# Patient Record
Sex: Female | Born: 2000
Health system: Southern US, Community
[De-identification: ages and names within clinical notes are randomized; demographics above are authoritative.]

---
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
## 2018-02-28 DIAGNOSIS — R000 Tachycardia, unspecified: Secondary | ICD-10-CM | POA: Diagnosis not present

## 2018-02-28 DIAGNOSIS — R112 Nausea with vomiting, unspecified: Secondary | ICD-10-CM | POA: Diagnosis not present

## 2018-06-25 DIAGNOSIS — R05 Cough: Secondary | ICD-10-CM | POA: Diagnosis not present

## 2018-06-25 DIAGNOSIS — B349 Viral infection, unspecified: Secondary | ICD-10-CM | POA: Diagnosis not present

## 2018-06-25 DIAGNOSIS — R509 Fever, unspecified: Secondary | ICD-10-CM | POA: Diagnosis not present

## 2018-06-25 DIAGNOSIS — R0602 Shortness of breath: Secondary | ICD-10-CM | POA: Diagnosis not present

## 2019-02-26 ENCOUNTER — Emergency Department (HOSPITAL_COMMUNITY): Payer: BC Managed Care – PPO

## 2019-02-26 ENCOUNTER — Emergency Department (HOSPITAL_COMMUNITY)
Admission: EM | Admit: 2019-02-26 | Discharge: 2019-02-26 | Disposition: A | Discharge status: Home / Self Care | Payer: BC Managed Care – PPO | Attending: Emergency Medicine | Admitting: Emergency Medicine

## 2019-02-26 ENCOUNTER — Other Ambulatory Visit: Payer: Self-pay

## 2019-02-26 ENCOUNTER — Encounter (HOSPITAL_COMMUNITY): Payer: Self-pay | Admitting: Emergency Medicine

## 2019-02-26 DIAGNOSIS — N1330 Unspecified hydronephrosis: Secondary | ICD-10-CM | POA: Insufficient documentation

## 2019-02-26 DIAGNOSIS — I959 Hypotension, unspecified: Secondary | ICD-10-CM | POA: Diagnosis not present

## 2019-02-26 DIAGNOSIS — R1032 Left lower quadrant pain: Secondary | ICD-10-CM

## 2019-02-26 DIAGNOSIS — N132 Hydronephrosis with renal and ureteral calculous obstruction: Secondary | ICD-10-CM | POA: Diagnosis not present

## 2019-02-26 DIAGNOSIS — N201 Calculus of ureter: Secondary | ICD-10-CM | POA: Diagnosis not present

## 2019-02-26 DIAGNOSIS — R52 Pain, unspecified: Secondary | ICD-10-CM | POA: Diagnosis not present

## 2019-02-26 DIAGNOSIS — R1084 Generalized abdominal pain: Secondary | ICD-10-CM | POA: Diagnosis not present

## 2019-02-26 DIAGNOSIS — R0689 Other abnormalities of breathing: Secondary | ICD-10-CM | POA: Diagnosis not present

## 2019-02-26 LAB — COMPREHENSIVE METABOLIC PANEL WITH GFR
ALT: 17 U/L (ref 0–44)
AST: 18 U/L (ref 15–41)
Albumin: 4.1 g/dL (ref 3.5–5.0)
Alkaline Phosphatase: 60 U/L (ref 38–126)
Anion gap: 12 (ref 5–15)
BUN: 11 mg/dL (ref 6–20)
CO2: 21 mmol/L — ABNORMAL LOW (ref 22–32)
Calcium: 9.5 mg/dL (ref 8.9–10.3)
Chloride: 107 mmol/L (ref 98–111)
Creatinine, Ser: 0.84 mg/dL (ref 0.44–1.00)
GFR calc Af Amer: >60 mL/min
GFR calc non Af Amer: >60 mL/min
Glucose, Bld: 124 mg/dL — ABNORMAL HIGH (ref 70–99)
Potassium: 3.6 mmol/L (ref 3.5–5.1)
Sodium: 140 mmol/L (ref 135–145)
Total Bilirubin: 1.8 mg/dL — ABNORMAL HIGH (ref 0.3–1.2)
Total Protein: 7.2 g/dL (ref 6.5–8.1)

## 2019-02-26 LAB — CBC
HCT: 41.8 % (ref 36.0–46.0)
Hemoglobin: 14.1 g/dL (ref 12.0–15.0)
MCH: 29.3 pg (ref 26.0–34.0)
MCHC: 33.7 g/dL (ref 30.0–36.0)
MCV: 86.7 fL (ref 80.0–100.0)
Platelets: 291 10*3/uL (ref 150–400)
RBC: 4.82 MIL/uL (ref 3.87–5.11)
RDW: 11.7 % (ref 11.5–15.5)
WBC: 7.9 10*3/uL (ref 4.0–10.5)
nRBC: 0 % (ref 0.0–0.2)

## 2019-02-26 LAB — LIPASE, BLOOD: Lipase: 25 U/L (ref 11–51)

## 2019-02-26 LAB — I-STAT BETA HCG BLOOD, ED (MC, WL, AP ONLY): I-stat hCG, quantitative: <5 m[IU]/mL

## 2019-02-26 MED ORDER — SODIUM CHLORIDE 0.9% FLUSH: 3.0000 mL | Freq: Once | INTRAVENOUS | Status: DC

## 2019-02-26 MED ORDER — ONDANSETRON 4 MG PO TBDP: 4.0000 mg | ORAL_TABLET | Freq: Three times a day (TID) | ORAL | 0 refills | Status: AC | PRN

## 2019-02-26 MED ORDER — OXYCODONE-ACETAMINOPHEN 5-325 MG PO TABS: 1.0000 | ORAL_TABLET | Freq: Four times a day (QID) | ORAL | 0 refills | Status: AC | PRN

## 2019-02-26 MED ORDER — PROMETHAZINE HCL 25 MG/ML IJ SOLN
12.5000 mg | Freq: Once | INTRAMUSCULAR | Status: AC
  Administered 2019-02-26: 12.5 mg via INTRAVENOUS
  Filled 2019-02-26: qty 1

## 2019-02-26 MED ORDER — HYDROMORPHONE HCL 1 MG/ML IJ SOLN
1.0000 mg | Freq: Once | INTRAMUSCULAR | Status: AC
  Administered 2019-02-26: 1 mg via INTRAVENOUS
  Filled 2019-02-26: qty 1

## 2019-02-26 MED ORDER — KETOROLAC TROMETHAMINE 30 MG/ML IJ SOLN
30.0000 mg | Freq: Once | INTRAMUSCULAR | Status: AC
  Administered 2019-02-26: 30 mg via INTRAVENOUS
  Filled 2019-02-26: qty 1

## 2019-02-26 MED ORDER — IBUPROFEN 800 MG PO TABS: 800.0000 mg | ORAL_TABLET | Freq: Three times a day (TID) | ORAL | 0 refills | Status: AC | PRN

## 2019-02-26 MED ORDER — ONDANSETRON HCL 4 MG/2ML IJ SOLN
4.0000 mg | Freq: Once | INTRAMUSCULAR | Status: AC
  Administered 2019-02-26: 4 mg via INTRAVENOUS
  Filled 2019-02-26: qty 2

## 2019-02-26 NOTE — ED Notes (Signed)
Dr. Jacqulyn Bath made aware that patient is requesting more pain and nausea medicine

## 2019-02-26 NOTE — ED Notes (Signed)
ED provider at bedside.

## 2019-02-26 NOTE — ED Notes (Signed)
Patient transported to [facility] 

## 2019-02-26 NOTE — Discharge Instructions (Signed)
You have been seen in the Emergency Department (ED) today for pain that we believe based on your workup, is caused by kidney stones.  As we have discussed, please drink plenty of fluids.  Please make a follow up appointment with the physician(s) listed elsewhere in this documentation. ° °You may take pain medication as needed but ONLY as prescribed.  Please also take your prescribed Flomax daily.  We also recommend that you take over-the-counter ibuprofen regularly according to label instructions over the next 5 days.  Take it with meals to minimize stomach discomfort. ° °Please see your doctor as soon as possible as stones may take 1-3 weeks to pass and you may require additional care or medications. ° °Do not drink alcohol, drive or participate in any other potentially dangerous activities while taking opiate pain medication as it may make you sleepy. Do not take this medication with any other sedating medications, either prescription or over-the-counter. If you were prescribed Percocet or Vicodin, do not take these with acetaminophen (Tylenol) as it is already contained within these medications. °  °Take Percocet as needed for severe pain.  This medication is an opiate (or narcotic) pain medication and can be habit forming.  Use it as little as possible to achieve adequate pain control.  Do not use or use it with extreme caution if you have a history of opiate abuse or dependence.  If you are on a pain contract with your primary care doctor or a pain specialist, be sure to let them know you were prescribed this medication today from the Emergency Department.  This medication is intended for your use only - do not give any to anyone else and keep it in a secure place where nobody else, especially children, have access to it.  It will also cause or worsen constipation, so you may want to consider taking an over-the-counter stool softener while you are taking this medication. ° °Return to the Emergency Department  (ED) or call your doctor if you have any worsening pain, fever, painful urination, are unable to urinate, or develop other symptoms that concern you. ° ° ° °Kidney Stones °Kidney stones (urolithiasis) are deposits that form inside your kidneys. The intense pain is caused by the stone moving through the urinary tract. When the stone moves, the ureter goes into spasm around the stone. The stone is usually passed in the urine.  °CAUSES  °A disorder that makes certain neck glands produce too much parathyroid hormone (primary hyperparathyroidism). °A buildup of uric acid crystals, similar to gout in your joints. °Narrowing (stricture) of the ureter. °A kidney obstruction present at birth (congenital obstruction). °Previous surgery on the kidney or ureters. °Numerous kidney infections. °SYMPTOMS  °Feeling sick to your stomach (nauseous). °Throwing up (vomiting). °Blood in the urine (hematuria). °Pain that usually spreads (radiates) to the groin. °Frequency or urgency of urination. °DIAGNOSIS  °Taking a history and physical exam. °Blood or urine tests. °CT scan. °Occasionally, an examination of the inside of the urinary bladder (cystoscopy) is performed. °TREATMENT  °Observation. °Increasing your fluid intake. °Extracorporeal shock wave lithotripsy--This is a noninvasive procedure that uses shock waves to break up kidney stones. °Surgery may be needed if you have severe pain or persistent obstruction. There are various surgical procedures. Most of the procedures are performed with the use of small instruments. Only small incisions are needed to accommodate these instruments, so recovery time is minimized. °The size, location, and chemical composition are all important variables that will determine the proper   choice of action for you. Talk to your health care provider to better understand your situation so that you will minimize the risk of injury to yourself and your kidney.  °HOME CARE INSTRUCTIONS  °Drink enough water  and fluids to keep your urine clear or pale yellow. This will help you to pass the stone or stone fragments. °Strain all urine through the provided strainer. Keep all particulate matter and stones for your health care provider to see. The stone causing the pain may be as small as a grain of salt. It is very important to use the strainer each and every time you pass your urine. The collection of your stone will allow your health care provider to analyze it and verify that a stone has actually passed. The stone analysis will often identify what you can do to reduce the incidence of recurrences. °Only take over-the-counter or prescription medicines for pain, discomfort, or fever as directed by your health care provider. °Keep all follow-up visits as told by your health care provider. This is important. °Get follow-up X-rays if required. The absence of pain does not always mean that the stone has passed. It may have only stopped moving. If the urine remains completely obstructed, it can cause loss of kidney function or even complete destruction of the kidney. It is your responsibility to make sure X-rays and follow-ups are completed. Ultrasounds of the kidney can show blockages and the status of the kidney. Ultrasounds are not associated with any radiation and can be performed easily in a matter of minutes. °Make changes to your daily diet as told by your health care provider. You may be told to: °Limit the amount of salt that you eat. °Eat 5 or more servings of fruits and vegetables each day. °Limit the amount of meat, poultry, fish, and eggs that you eat. °Collect a 24-hour urine sample as told by your health care provider. You may need to collect another urine sample every 6-12 months. °SEEK MEDICAL CARE IF: °You experience pain that is progressive and unresponsive to any pain medicine you have been prescribed. °SEEK IMMEDIATE MEDICAL CARE IF:  °Pain cannot be controlled with the prescribed medicine. °You have a  fever or shaking chills. °The severity or intensity of pain increases over 18 hours and is not relieved by pain medicine. °You develop a new onset of abdominal pain. °You feel faint or pass out. °You are unable to urinate. °  °This information is not intended to replace advice given to you by your health care provider. Make sure you discuss any questions you have with your health care provider. °  °Document Released: 01/20/2005 Document Revised: 10/11/2014 Document Reviewed: 06/23/2012 °Elsevier Interactive Patient Education ©2016 [employer] ° ° °

## 2019-02-26 NOTE — ED Provider Notes (Signed)
Emergency Department Provider Note   I have reviewed the triage vital signs and the nursing notes.   HISTORY  Chief Complaint Abdominal Pain and Emesis   HPI Renee Mueller is a 19 y.o. female presents to the emergency department by EMS with acute onset left lower quadrant abdominal pain.  Patient states that she was driving down the road when she suddenly developed severe, debilitating pain in the left lower quadrant with nausea and vomiting.  She pulled over at a gas station and called 911.  She is not experiencing chest pain or shortness of breath.  No similar pain in the past.  Denies any diarrhea, vaginal discharge, dysuria, hesitancy, urgency.  No concern for pregnancy. Patient given Fentanyl and Zofran en route with mild reduction in symptoms.   History reviewed. No pertinent past medical history.  There are no problems to display for this patient.   History reviewed. No pertinent surgical history.  Allergies Patient has no known allergies.  No family history on file.  Social History Social History   Tobacco Use  . Smoking status: Never Smoker  . Smokeless tobacco: Never Used  Substance Use Topics  . Alcohol use: Never  . Drug use: Never    Review of Systems  Constitutional: No fever/chills Eyes: No visual changes. ENT: No sore throat. Cardiovascular: Denies chest pain. Respiratory: Denies shortness of breath. Gastrointestinal: LLQ abdominal pain. Positive nausea and vomiting.  No diarrhea.  No constipation. Genitourinary: Negative for dysuria. Musculoskeletal: Negative for back pain. Skin: Negative for rash. Neurological: Negative for headaches, focal weakness or numbness.  10-point ROS otherwise negative.  ____________________________________________   PHYSICAL EXAM:  VITAL SIGNS: ED Triage Vitals [02/26/19 0947]  Enc Vitals Group     BP 118/88     Pulse Rate 99     Resp 20     Temp 98.1 F (36.7 C)     Temp Source Oral     SpO2 100 %    Constitutional: Alert and oriented but appears very uncomfortable with frequent shifting in bed.  Eyes: Conjunctivae are normal. Head: Atraumatic. Nose: No congestion/rhinnorhea. Mouth/Throat: Mucous membranes are moist.   Neck: No stridor.  Cardiovascular: Normal rate, regular rhythm. Good peripheral circulation. Grossly normal heart sounds.   Respiratory: Normal respiratory effort.  No retractions. Lungs CTAB. Gastrointestinal: Soft with mild diffuse tenderness without rebound or guarding. No distention.  Musculoskeletal:  No gross deformities of extremities. Neurologic:  Normal speech and language.  Skin:  Skin is warm, dry and intact. No rash noted.  ____________________________________________   LABS (all labs ordered are listed, but only abnormal results are displayed)  Labs Reviewed  COMPREHENSIVE METABOLIC PANEL - Abnormal; Notable for the following components:      Result Value   CO2 21 (*)    Glucose, Bld 124 (*)    Total Bilirubin 1.8 (*)    All other components within normal limits  LIPASE, BLOOD  CBC  I-STAT BETA HCG BLOOD, ED ([facility], [facility], [facility] ONLY)   ____________________________________________  RADIOLOGY  CT Renal Stone Study  Result Date: 02/26/2019 CLINICAL DATA:  Flank pain along the left side. Radiates to the back. EXAM: CT ABDOMEN AND PELVIS WITHOUT CONTRAST TECHNIQUE: Multidetector CT imaging of the abdomen and pelvis was performed following the standard protocol without IV contrast. COMPARISON:  None. FINDINGS: Lower chest: No acute abnormality. Hepatobiliary: No focal liver abnormality is seen. No gallstones, gallbladder wall thickening, or biliary dilatation. Pancreas: Unremarkable. No pancreatic ductal dilatation or surrounding inflammatory changes.  Spleen: Normal in size without focal abnormality. Adrenals/Urinary Tract: Adrenal glands are unremarkable. Punctate nonobstructing right renal calculus. Mild left hydroureteronephrosis with mild left periureteral  hazy inflammatory changes. 3 mm calcification in the left lower pelvis which may reflect a phlebolith versus a distal left ureteral calculus (image 75/series 3). Bladder is unremarkable. Stomach/Bowel: Stomach is within normal limits. Appendix appears normal. No evidence of bowel wall thickening, distention, or inflammatory changes. Vascular/Lymphatic: No significant vascular findings are present. No enlarged abdominal or pelvic lymph nodes. Reproductive: Uterus and bilateral adnexa are unremarkable. Other: No abdominal wall hernia or abnormality. No abdominopelvic ascites. Musculoskeletal: No acute or significant osseous findings. IMPRESSION: 1. Mild left hydroureteronephrosis with mild left periureteral hazy inflammatory changes. 3 mm calcification in the left lower pelvis which may reflect a phlebolith versus a distal left ureteral calculus (image 75/series 3). Electronically Signed   By: Kathreen Devoid   On: 02/26/2019 12:09   US PELVIC COMPLETE W TRANSVAGINAL AND TORSION R/O  Result Date: 02/26/2019 CLINICAL DATA:  Left lower quadrant pain EXAM: TRANSABDOMINAL AND TRANSVAGINAL ULTRASOUND OF PELVIS DOPPLER ULTRASOUND OF OVARIES TECHNIQUE: Both transabdominal and transvaginal ultrasound examinations of the pelvis were performed. Transabdominal technique was performed for global imaging of the pelvis including uterus, ovaries, adnexal regions, and pelvic cul-de-sac. It was necessary to proceed with endovaginal exam following the transabdominal exam to visualize the endometrium and bilateral ovaries. Color and duplex Doppler ultrasound was utilized to evaluate blood flow to the ovaries. COMPARISON:  None. FINDINGS: Uterus Measurements: 6.3 x 2.8 x 3.5 cm = volume: 32.8 mL. No fibroids or other mass visualized. Endometrium Thickness: 3 mm.  No focal abnormality visualized. Right ovary Measurements: 3.2 x 2.3 x 1.8 cm = volume: 7.0 mL. Normal appearance/no adnexal mass. Left ovary Measurements: 3.2 x 1.8 x 2.1 cm =  volume: 6.0 mL. Normal appearance/no adnexal mass. Pulsed Doppler evaluation of both ovaries demonstrates normal low-resistance arterial and venous waveforms. Other findings No abnormal free fluid. IMPRESSION: Negative pelvic ultrasound. No evidence of ovarian torsion. Electronically Signed   By: Julian Hy M.D.   On: 02/26/2019 11:10    ____________________________________________   PROCEDURES  Procedure(s) performed:   Procedures  None ____________________________________________   INITIAL IMPRESSION / ASSESSMENT AND PLAN / ED COURSE  Pertinent labs & imaging results that were available during my care of the patient were reviewed by me and considered in my medical decision making (see chart for details).   Patient presents to the emergency department with acute onset left lower quadrant abdominal pain.  She appears very uncomfortable upon arrival.  Her pregnancy test is negative.  Vital signs are largely unremarkable.  Initial concern is to rule out acute ovarian torsion and patient was given pain medication and taken immediately for pelvic ultrasound.  This result was reviewed which shows normal ultrasound with no evidence of torsion.  Other considerations include kidney stone.  Patient was sent for CT renal and results are pending.  Labs reviewed.  Continue pain management and reassess.  TVUS negative for torsion. CT renal sent with left kidney stone which correlates with patients presentation. Plan for discharge home with pain meds and Urology follow up. [place] drug database reviewed prior to Rx for Percocet. Discussed ED return precautions.  ____________________________________________  FINAL CLINICAL IMPRESSION(S) / ED DIAGNOSES  Final diagnoses:  Ureterolithiasis     MEDICATIONS GIVEN DURING THIS VISIT:  Medications  ondansetron (ZOFRAN) injection 4 mg (4 mg Intravenous Given 02/26/19 1017)  HYDROmorphone (DILAUDID) injection 1 mg (1  mg Intravenous Given 02/26/19 1017)    ketorolac (TORADOL) 30 MG/ML injection 30 mg (30 mg Intravenous Given 02/26/19 1137)  promethazine (PHENERGAN) injection 12.5 mg (12.5 mg Intravenous Given 02/26/19 1137)     NEW OUTPATIENT MEDICATIONS STARTED DURING THIS VISIT:  Discharge Medication List as of 02/26/2019  1:25 PM    START taking these medications   Details  ibuprofen (ADVIL) 800 MG tablet Take 1 tablet (800 mg total) by mouth every 8 (eight) hours as needed., Starting Sat 02/26/2019, Normal    ondansetron (ZOFRAN ODT) 4 MG disintegrating tablet Take 1 tablet (4 mg total) by mouth every 8 (eight) hours as needed for nausea or vomiting., Starting Sat 02/26/2019, Normal    oxyCODONE-acetaminophen (PERCOCET/ROXICET) 5-325 MG tablet Take 1 tablet by mouth every 6 (six) hours as needed for severe pain., Starting Sat 02/26/2019, Normal        Note:  This document was prepared using Dragon voice recognition software and may include unintentional dictation errors.  Alona Bene, MD, [facility]    Avis Tirone, Arlyss Repress, MD 02/26/19 1910

## 2019-02-26 NOTE — ED Triage Notes (Signed)
Pt to triage via [facility].  C/o sudden onset of LLQ pain, nausea, and vomiting that started 15 min before EMS arrival.  Pt was driving and pulled over at gas station to call EMS.  Denies diarrhea.  Pt writhing in pain.  EMS administered Fentanyl 50 mcg and Zofran 4mg  PTA.

## 2019-02-26 NOTE — ED Notes (Signed)
Patient verbalized understanding of dc instructions, vss, ambulatory with nad.   

## 2019-02-26 NOTE — ED Notes (Signed)
Patient transported to CT 

## 2019-03-02 ENCOUNTER — Ambulatory Visit: Payer: Self-pay

## 2019-03-02 ENCOUNTER — Ambulatory Visit: Payer: Self-pay | Admitting: *Deleted

## 2019-03-02 DIAGNOSIS — M79601 Pain in right arm: Secondary | ICD-10-CM | POA: Diagnosis not present

## 2019-03-02 DIAGNOSIS — I808 Phlebitis and thrombophlebitis of other sites: Secondary | ICD-10-CM | POA: Diagnosis not present

## 2019-03-02 DIAGNOSIS — I809 Phlebitis and thrombophlebitis of unspecified site: Secondary | ICD-10-CM | POA: Diagnosis not present

## 2019-03-02 NOTE — Telephone Encounter (Signed)
Pt called back received call and provied recommendations.  On another call voiced understanding.

## 2019-03-02 NOTE — Telephone Encounter (Signed)
Incoming call from Patient  With complaint that she was in the ED being treated for Kidney Stones. Received  Iv medication for Pain.   Patient states that her elbow is sore notices her elbow is sore and slightly swollen Recommend Patient go to Urgent care to have it evaluated.  Patient voiced understanding.      Reason for Disposition . Entire arm is swollen  Answer Assessment - Initial Assessment Questions 1. ONSET: "When did the pain start?"    Past 2 days  2. LOCATION: "Where is the pain located?"     Right elbow 3. PAIN: "How bad is the pain?" (Scale 1-10; or mild, moderate, severe) - MILD - doesn't interfere with normal activities - MODERATE - interferes with normal activities (e.g., work or school) or awakens from sleep - SEVERE - excruciating pain, unable to do use arm at all     mild 4. WORK OR EXERCISE: "Has there been any recent work or exercise that involved this part of the body?"     denies 5. CAUSE: "What do you think is causing the elbow pain?"     *No Answer* 6. OTHER SYMPTOMS: "Do you have any other symptoms?" (e.g., neck pain, elbow swelling, rash, fever)     Pink, swollen  7. PREGNANCY: "Is there any chance you are pregnant?" "When was your last menstrual period?"     Not pregnant.  Protocols used: ELBOW PAIN-A-AH

## 2019-03-02 NOTE — Telephone Encounter (Signed)
Summary: IV pain   Pt stated she went to the ED on Saturday and had an IV put in near wrist on her right hand and now the bend of her elbow is somewhat red and painful. She would like to speak with a nurse about whether this is normal.      Attempted to call home number- message states disconnected. Left message on cell number to call back regarding symptoms.

## 2019-04-07 DIAGNOSIS — Z79899 Other long term (current) drug therapy: Secondary | ICD-10-CM | POA: Diagnosis not present

## 2019-04-07 DIAGNOSIS — Z131 Encounter for screening for diabetes mellitus: Secondary | ICD-10-CM | POA: Diagnosis not present

## 2019-04-07 DIAGNOSIS — F418 Other specified anxiety disorders: Secondary | ICD-10-CM | POA: Diagnosis not present

## 2019-04-07 DIAGNOSIS — R6889 Other general symptoms and signs: Secondary | ICD-10-CM | POA: Diagnosis not present

## 2019-04-07 DIAGNOSIS — Z6823 Body mass index (BMI) 23.0-23.9, adult: Secondary | ICD-10-CM | POA: Diagnosis not present

## 2019-04-21 DIAGNOSIS — F418 Other specified anxiety disorders: Secondary | ICD-10-CM | POA: Diagnosis not present

## 2019-04-21 DIAGNOSIS — Z6823 Body mass index (BMI) 23.0-23.9, adult: Secondary | ICD-10-CM | POA: Diagnosis not present

## 2019-12-23 DIAGNOSIS — M67431 Ganglion, right wrist: Secondary | ICD-10-CM | POA: Diagnosis not present

## 2020-09-30 IMAGING — US US PELVIS COMPLETE TRANSABD/TRANSVAG W DUPLEX
1 series · 13 of 25 positions shown · non-contrast
Comparison: None.

CLINICAL DATA: Left lower quadrant pain

EXAM:
TRANSABDOMINAL AND TRANSVAGINAL ULTRASOUND OF PELVIS
DOPPLER ULTRASOUND OF OVARIES
TECHNIQUE: Both transabdominal and transvaginal ultrasound examinations of the
pelvis were performed. Transabdominal technique was performed for
global imaging of the pelvis including uterus, ovaries, adnexal
regions, and pelvic cul-de-sac.
It was necessary to proceed with endovaginal exam following the
transabdominal exam to visualize the endometrium and bilateral
ovaries. Color and duplex Doppler ultrasound was utilized to
evaluate blood flow to the ovaries.

[Series 1: us pelvis complete transabd/transvag w duplex · 13 of 85 slices shown]
[im 1/85]
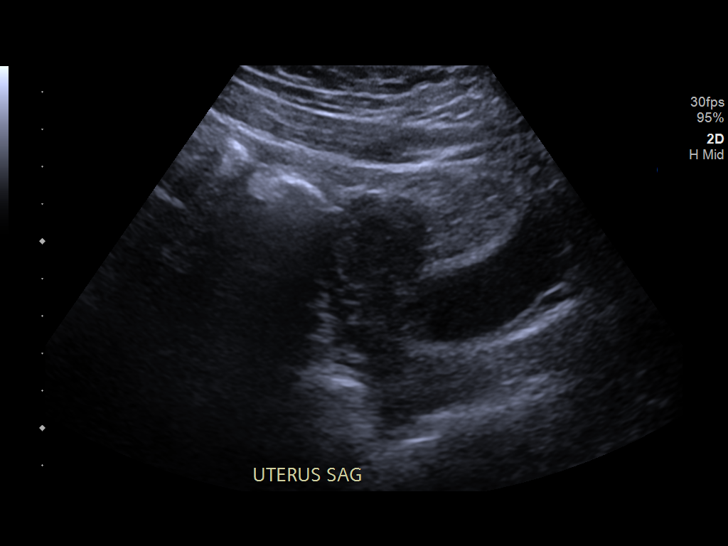
[im 8/85]
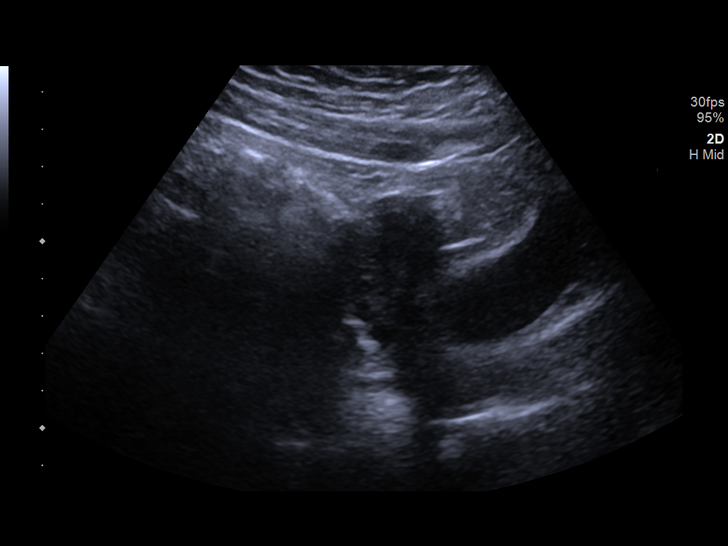
[im 15/85]
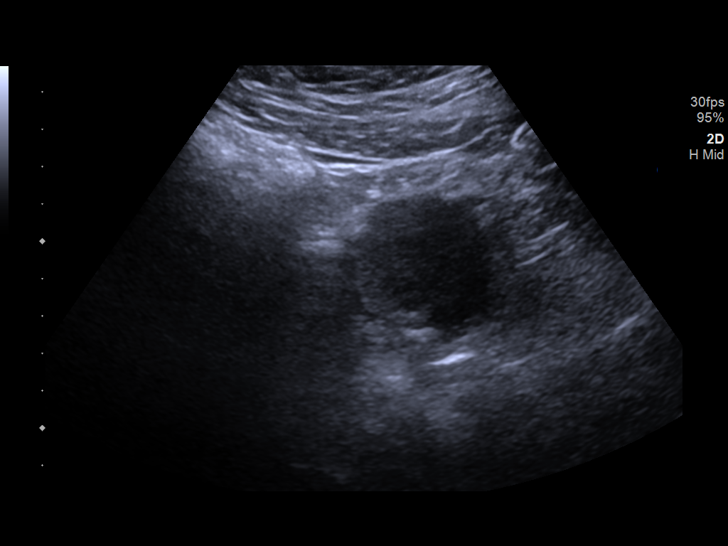
[im 22/85]
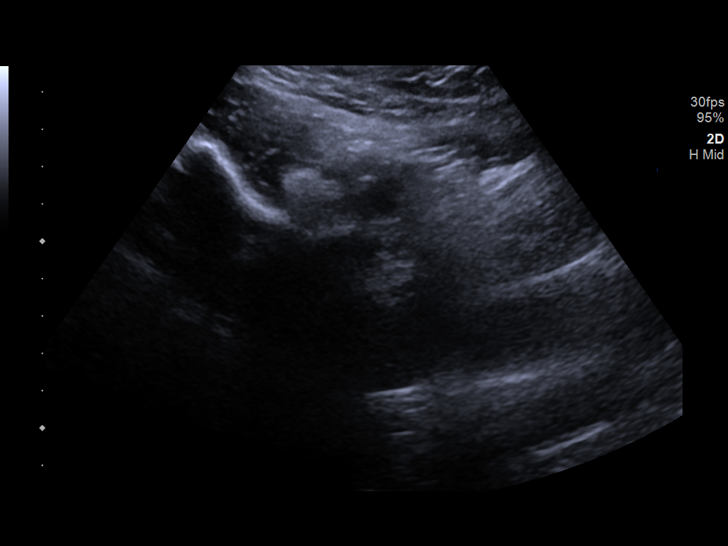
[im 29/85]
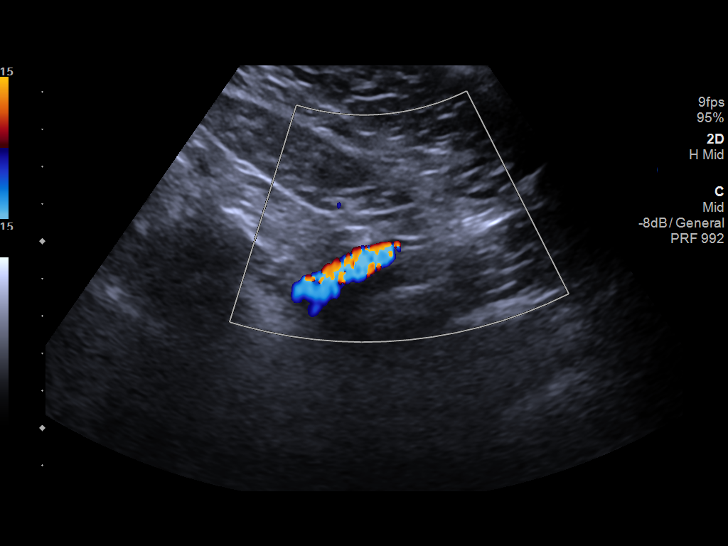
[im 36/85]
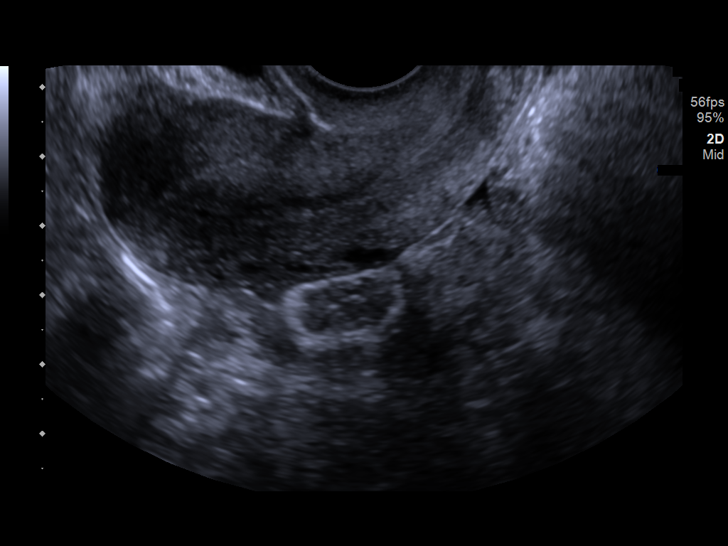
[im 43/85]
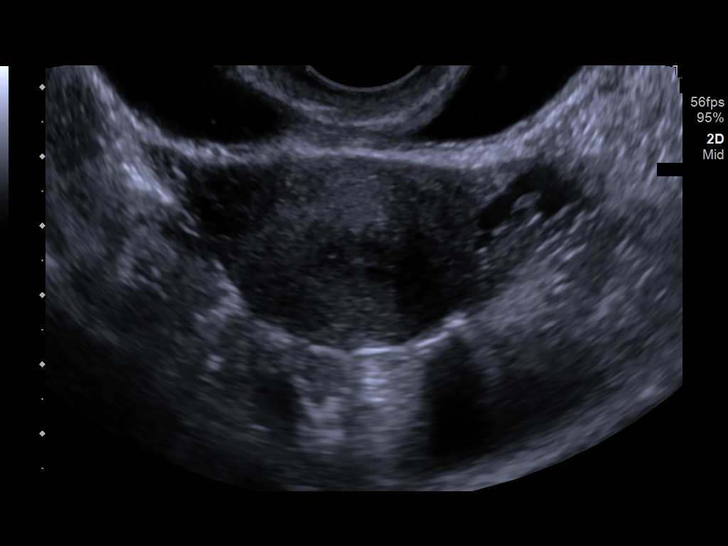
[im 50/85]
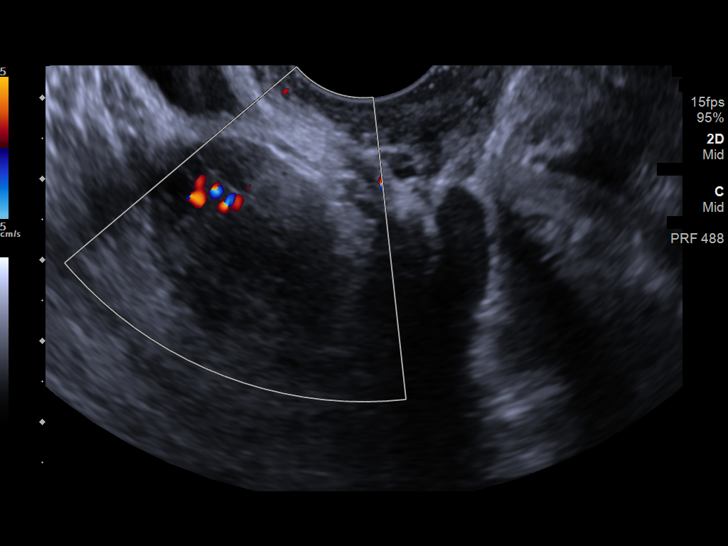
[im 57/85]
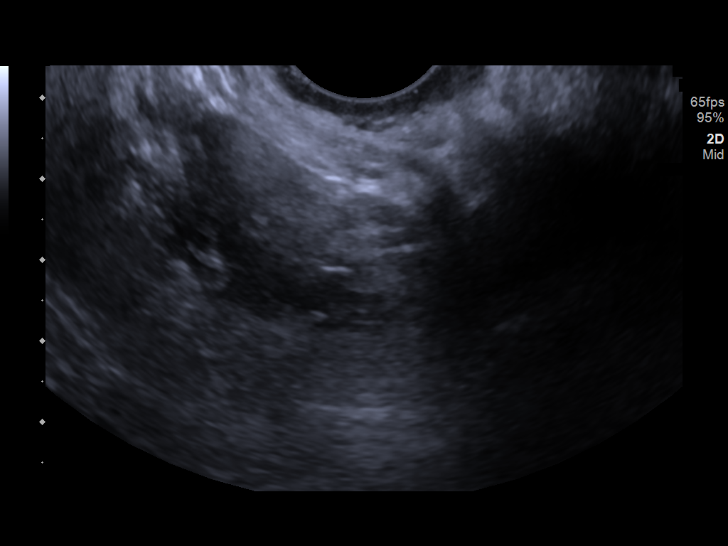
[im 64/85]
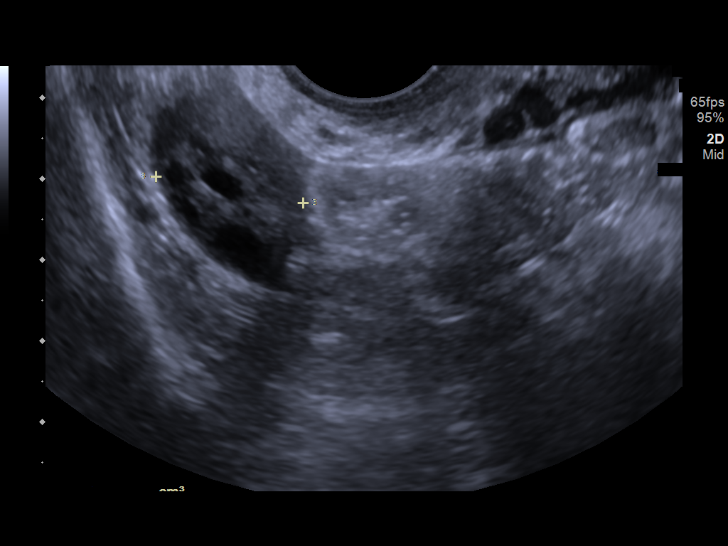
[im 71/85]
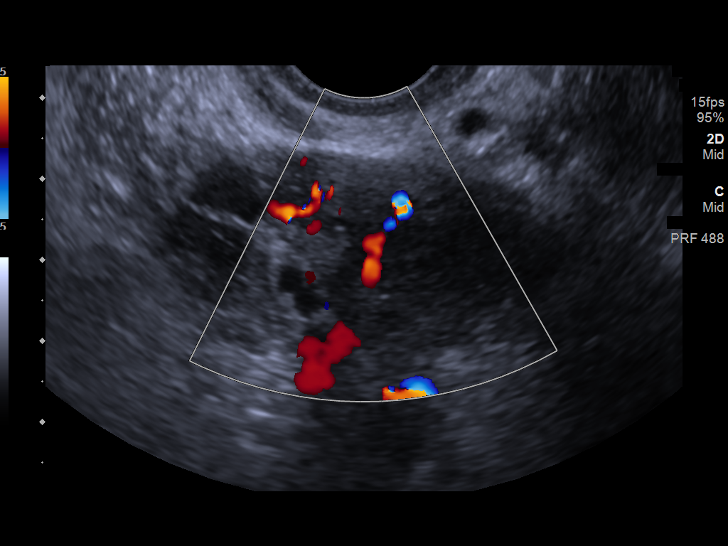
[im 78/85]
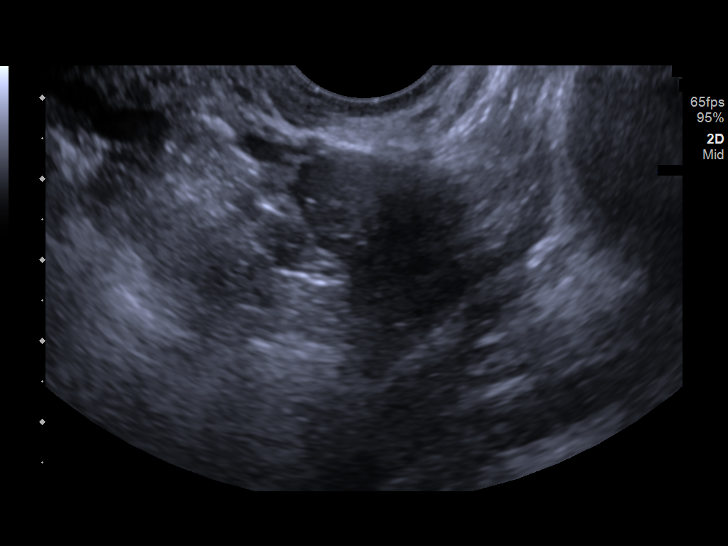
[im 85/85]
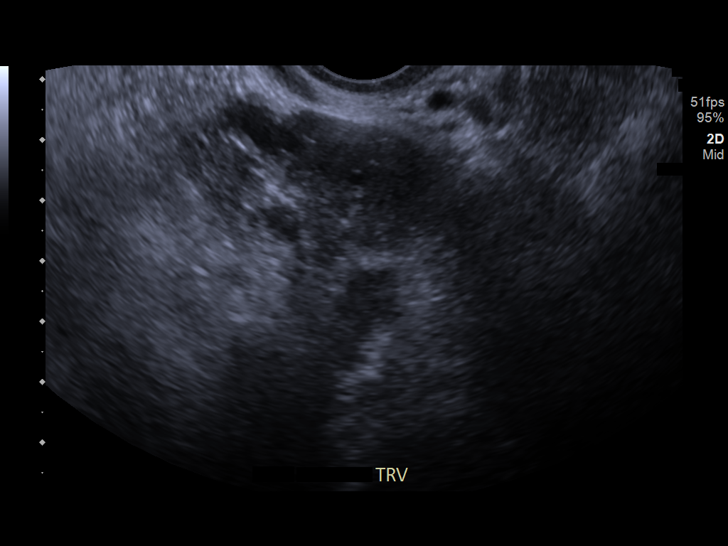

[13 of 25 positions shown; findings below may reference images not displayed]

FINDINGS: Uterus

Measurements: 6.3 x 2.8 x 3.5 cm = volume: 32.8 mL. No fibroids or
other mass visualized.

Endometrium

Thickness: 3 mm.  No focal abnormality visualized.

Right ovary

Measurements: 3.2 x 2.3 x 1.8 cm = volume: 7.0 mL. Normal
appearance/no adnexal mass.

Left ovary

Measurements: 3.2 x 1.8 x 2.1 cm = volume: 6.0 mL. Normal
appearance/no adnexal mass.

Pulsed Doppler evaluation of both ovaries demonstrates normal
low-resistance arterial and venous waveforms.

Other findings

No abnormal free fluid.
IMPRESSION: Negative pelvic ultrasound.

No evidence of ovarian torsion.

## 2020-09-30 IMAGING — CT CT RENAL STONE PROTOCOL
2 of 4 series · 16 of 46 positions shown, 18 images · non-contrast
Comparison: None.

CLINICAL DATA: Flank pain along the left side. Radiates to the
back.

EXAM:
CT ABDOMEN AND PELVIS WITHOUT CONTRAST
TECHNIQUE: Multidetector CT imaging of the abdomen and pelvis was performed
following the standard protocol without IV contrast.

[Series 3: ap without · axial · non-contrast · 0.76mm/px · z∈[+712,+1117]mm · 13 of 93 slices shown, 15 images]
[im 6/93  soft-tissue]
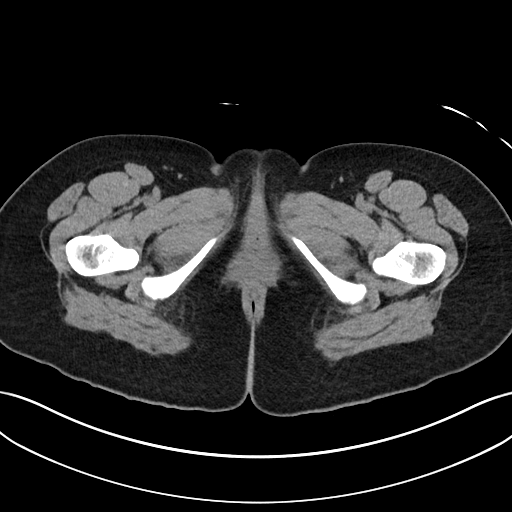
[im 6/93  bone]
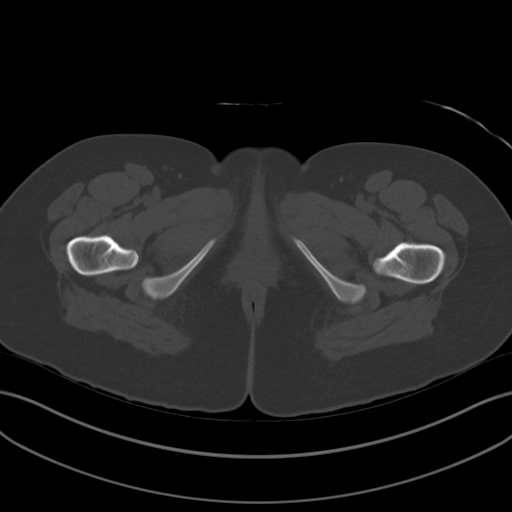
[im 11/93  soft-tissue]
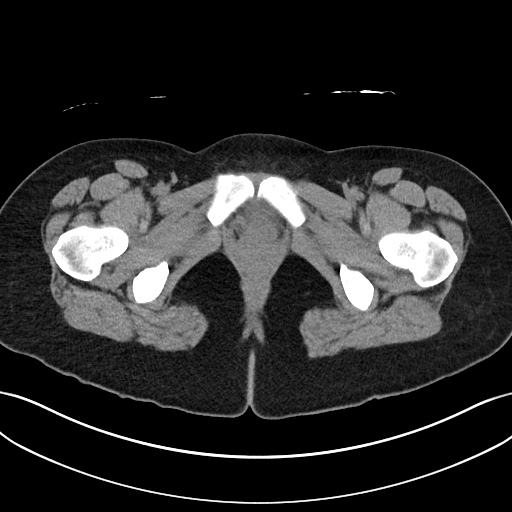
[im 21/93  soft-tissue]
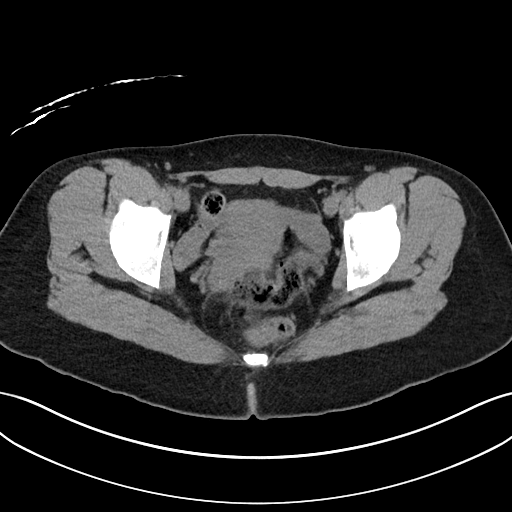
[im 26/93  soft-tissue]
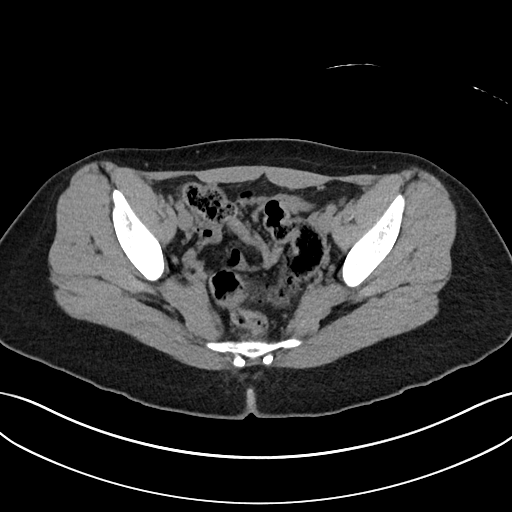
[im 31/93  soft-tissue]
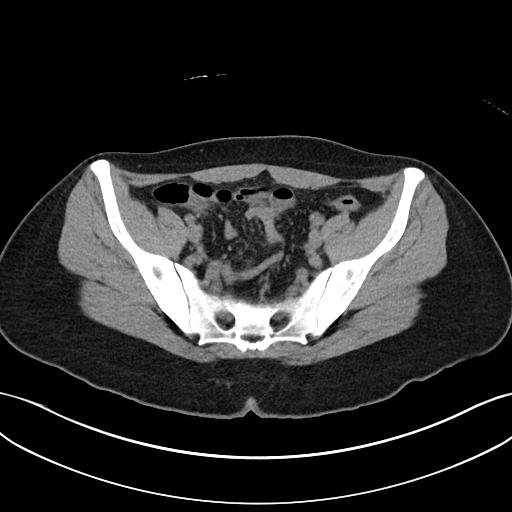
[im 41/93  soft-tissue]
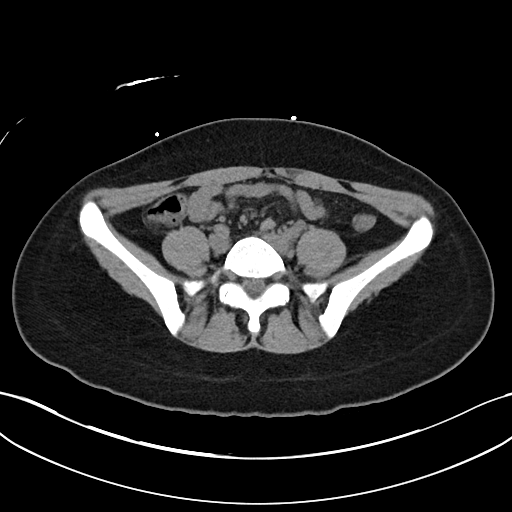
[im 47/93  soft-tissue]
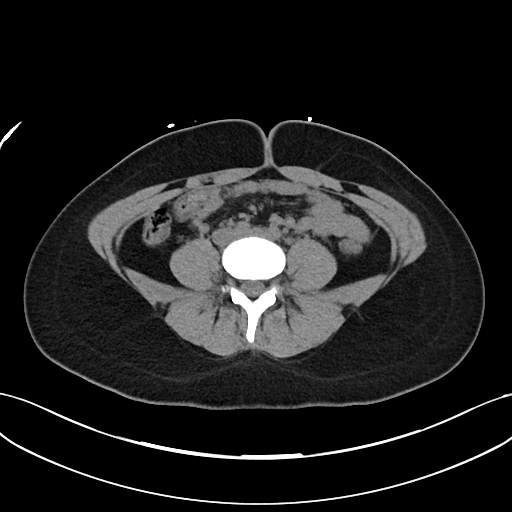
[im 52/93  soft-tissue]
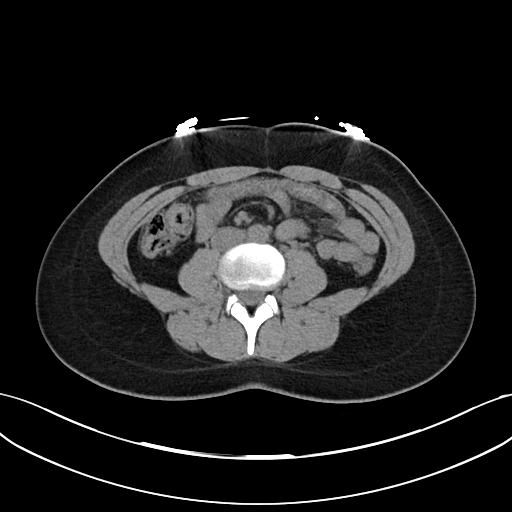
[im 62/93  soft-tissue]
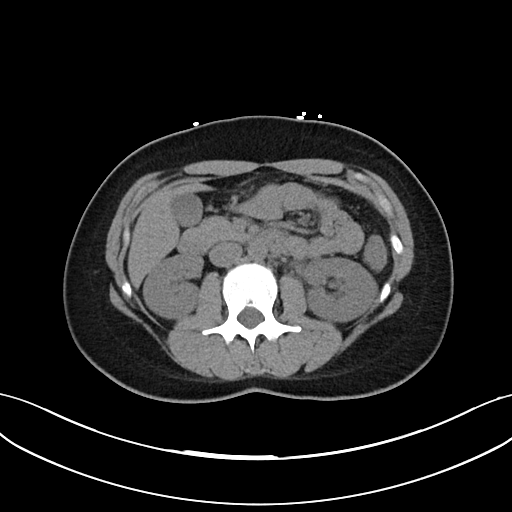
[im 62/93  bone]
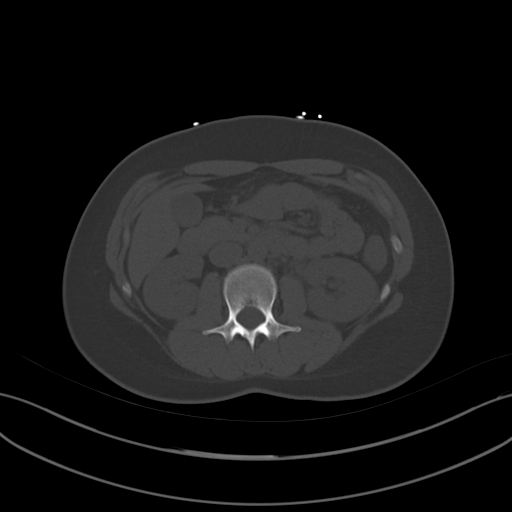
[im 67/93  soft-tissue]
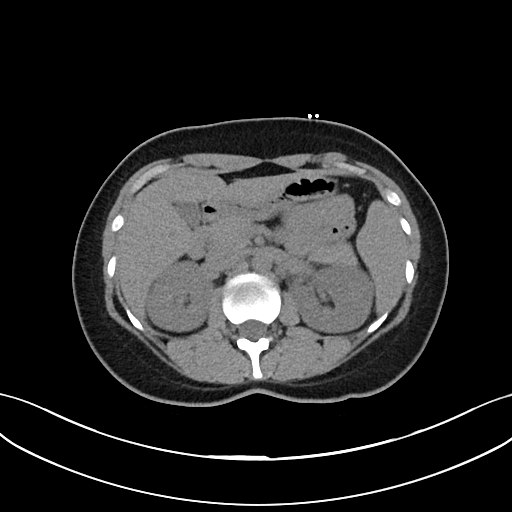
[im 72/93  soft-tissue]
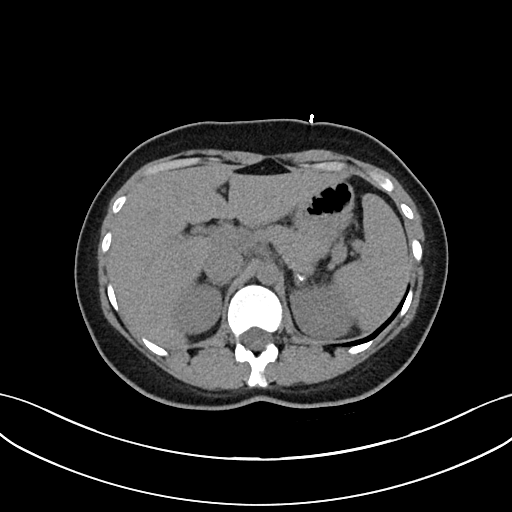
[im 82/93  soft-tissue]
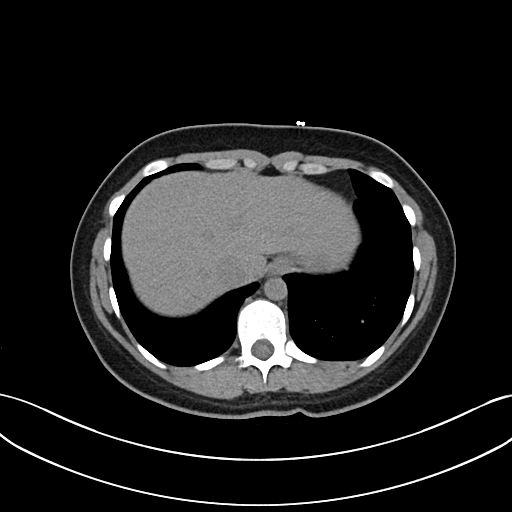
[im 87/93  soft-tissue]
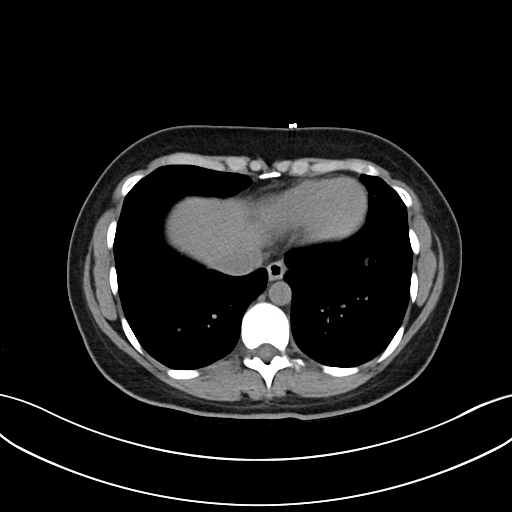

[Series 6: cor · coronal · 0.71mm/px · 3 of 65 slices shown]
[im 22/65  soft-tissue]
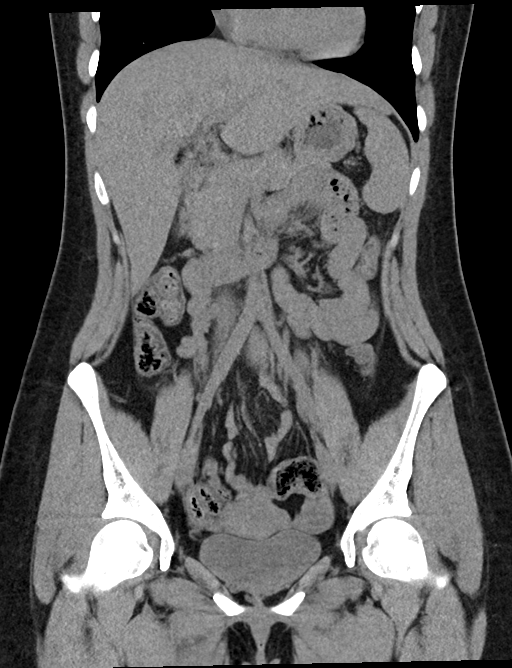
[im 29/65  soft-tissue]
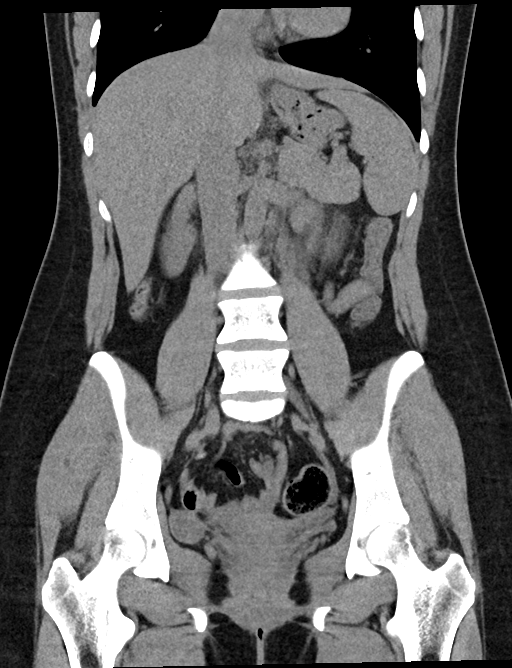
[im 36/65  soft-tissue]
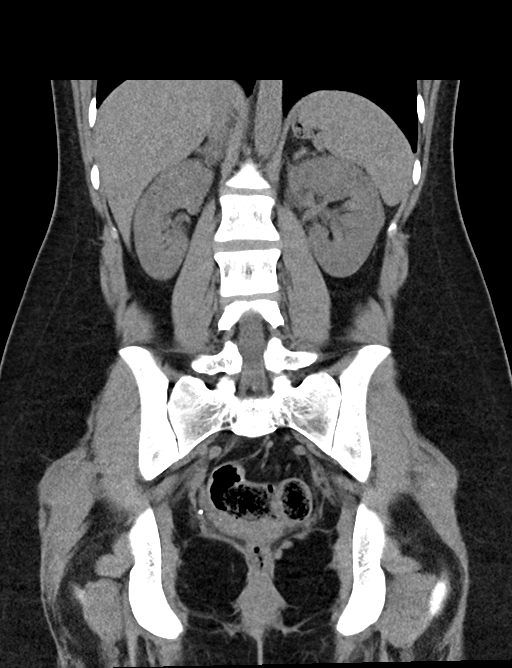

[16 of 46 positions shown; findings below may reference images not displayed]

FINDINGS: Lower chest: No acute abnormality.

Hepatobiliary: No focal liver abnormality is seen. No gallstones,
gallbladder wall thickening, or biliary dilatation.

Pancreas: Unremarkable. No pancreatic ductal dilatation or
surrounding inflammatory changes.

Spleen: Normal in size without focal abnormality.

Adrenals/Urinary Tract: Adrenal glands are unremarkable. Punctate
nonobstructing right renal calculus. Mild left hydroureteronephrosis
with mild left periureteral hazy inflammatory changes. 3 mm
calcification in the left lower pelvis which may reflect a
phlebolith versus a distal left ureteral calculus (image 75/series
3). Bladder is unremarkable.

Stomach/Bowel: Stomach is within normal limits. Appendix appears
normal. No evidence of bowel wall thickening, distention, or
inflammatory changes.

Vascular/Lymphatic: No significant vascular findings are present. No
enlarged abdominal or pelvic lymph nodes.

Reproductive: Uterus and bilateral adnexa are unremarkable.

Other: No abdominal wall hernia or abnormality. No abdominopelvic
ascites.

Musculoskeletal: No acute or significant osseous findings.
IMPRESSION: 1. Mild left hydroureteronephrosis with mild left periureteral hazy
inflammatory changes. 3 mm calcification in the left lower pelvis
which may reflect a phlebolith versus a distal left ureteral
calculus (image 75/series 3).
